# Patient Record
Sex: Female | Born: 1995 | Race: Black or African American | Hispanic: No | Marital: Single | Smoking: Never smoker
Health system: Southern US, Community
[De-identification: ages and names within clinical notes are randomized; demographics above are authoritative.]

---

## 2016-02-14 ENCOUNTER — Encounter (HOSPITAL_COMMUNITY): Payer: Self-pay | Admitting: Family Medicine

## 2016-02-14 ENCOUNTER — Ambulatory Visit (HOSPITAL_COMMUNITY)
Admission: EM | Admit: 2016-02-14 | Discharge: 2016-02-14 | Disposition: A | Discharge status: Home / Self Care | Payer: Medicaid Other | Attending: Internal Medicine | Admitting: Internal Medicine

## 2016-02-14 DIAGNOSIS — A499 Bacterial infection, unspecified: Secondary | ICD-10-CM

## 2016-02-14 DIAGNOSIS — N760 Acute vaginitis: Secondary | ICD-10-CM | POA: Diagnosis not present

## 2016-02-14 DIAGNOSIS — B9689 Other specified bacterial agents as the cause of diseases classified elsewhere: Secondary | ICD-10-CM

## 2016-02-14 MED ORDER — METRONIDAZOLE 500 MG PO TABS
500.0000 mg | ORAL_TABLET | Freq: Two times a day (BID) | ORAL | 2 refills | Status: DC
Start: 2016-02-14 — End: 2016-09-21

## 2016-02-14 NOTE — ED Triage Notes (Signed)
Pt here for vaginal discharge. sts creamy and white. sts hx of chronic BV.

## 2016-02-14 NOTE — Discharge Instructions (Signed)
Continue probiotics

## 2016-02-15 NOTE — ED Provider Notes (Signed)
CSN: [id number]     Arrival date & time 02/14/16  1750 History   First MD Initiated Contact with Patient 02/14/16 1806     Chief Complaint  Patient presents with  . Vaginal Discharge   (Consider location/radiation/quality/duration/timing/severity/associated sxs/prior Treatment) HPI 20 Y/O FEMALE STATES SHE IS HAVING A FLARE OF BV. NOTED ODOR AND DISCHARGE 2 DAYS AGO. NOT SEXUALLY ACTIVE. NO PAIN, NO OTHER COMPLAINTS.  History reviewed. No pertinent past medical history. History reviewed. No pertinent surgical history. History reviewed. No pertinent family history. Social History  Substance Use Topics  . Smoking status: Never Smoker  . Smokeless tobacco: Never Used  . Alcohol use Not on file   OB History    No data available     Review of Systems  Denies: HEADACHE, NAUSEA, ABDOMINAL PAIN, CHEST PAIN, CONGESTION, DYSURIA, SHORTNESS OF BREATH  Allergies  Review of patient's allergies indicates no known allergies.  Home Medications   Prior to Admission medications   Medication Sig Start Date End Date Taking? Authorizing Provider  metroNIDAZOLE (FLAGYL) 500 MG tablet Take 1 tablet (500 mg total) by mouth 2 (two) times daily. 02/14/16   Tharon AquasFrank C Agustina Witzke, PA   Meds Ordered and Administered this Visit  Medications - No data to display  BP 133/65   Pulse 70   Temp 98.5 F (36.9 C)   Resp 18   LMP 12/13/2015   SpO2 100%  No data found.   Physical Exam NURSES NOTES AND VITAL SIGNS REVIEWED. CONSTITUTIONAL: Well developed, well nourished, no acute distress HEENT: normocephalic, atraumatic EYES: Conjunctiva normal NECK:normal ROM, supple, no adenopathy PULMONARY:No respiratory distress, normal effort ABDOMINAL: Soft, ND, NT BS+, No CVAT MUSCULOSKELETAL: Normal ROM of all extremities,  SKIN: warm and dry without rash PSYCHIATRIC: Mood and affect, behavior are normal  Urgent Care Course   Clinical Course    Procedures (including critical care time)  Labs Review Labs  Reviewed - No data to display  Imaging Review No results found.   Visual Acuity Review  Right Eye Distance:   Left Eye Distance:   Bilateral Distance:    Right Eye Near:   Left Eye Near:    Bilateral Near:       PT STATES SHE HAS CHRONIC BV, AND HAS A FLARE AT THIS TIME, SHE KINDLY DECLINES PELVIC. NOT SEXUALLY ACTIVE, STATES GEL IS TOO MESSY, WOULD PREFER PILLS.  ALSO HAS USED PROBIOTICS AND BORIC ACID, WHICH SHE STATES HELPS A GREAT DEAL.   MDM   1. BV (bacterial vaginosis)     Patient is reassured that there are no issues that require transfer to higher level of care at this time or additional tests. Patient is advised to continue home symptomatic treatment. Patient is advised that if there are new or worsening symptoms to attend the emergency department, contact primary care provider, or return to UC. Instructions of care provided discharged home in stable condition.    THIS NOTE WAS GENERATED USING A VOICE RECOGNITION SOFTWARE PROGRAM. ALL REASONABLE EFFORTS  WERE MADE TO PROOFREAD THIS DOCUMENT FOR ACCURACY.  I have verbally reviewed the discharge instructions with the patient. A printed AVS was given to the patient.  All questions were answered prior to discharge.      Tharon AquasFrank C Fredderick Swanger, PA 02/15/16 1002

## 2016-05-01 ENCOUNTER — Emergency Department (HOSPITAL_COMMUNITY)
Admission: EM | Admit: 2016-05-01 | Discharge: 2016-05-01 | Disposition: A | Discharge status: Home / Self Care | Payer: Medicaid Other | Attending: Emergency Medicine | Admitting: Emergency Medicine

## 2016-05-01 ENCOUNTER — Encounter (HOSPITAL_COMMUNITY): Payer: Self-pay

## 2016-05-01 DIAGNOSIS — S3992XA Unspecified injury of lower back, initial encounter: Secondary | ICD-10-CM | POA: Diagnosis present

## 2016-05-01 DIAGNOSIS — S39012A Strain of muscle, fascia and tendon of lower back, initial encounter: Secondary | ICD-10-CM | POA: Insufficient documentation

## 2016-05-01 DIAGNOSIS — Y939 Activity, unspecified: Secondary | ICD-10-CM | POA: Insufficient documentation

## 2016-05-01 DIAGNOSIS — Y92410 Unspecified street and highway as the place of occurrence of the external cause: Secondary | ICD-10-CM | POA: Insufficient documentation

## 2016-05-01 DIAGNOSIS — Y999 Unspecified external cause status: Secondary | ICD-10-CM | POA: Diagnosis not present

## 2016-05-01 MED ORDER — CYCLOBENZAPRINE HCL 10 MG PO TABS: 10.0000 mg | ORAL_TABLET | Freq: Two times a day (BID) | ORAL | 0 refills | Status: DC | PRN
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
## 2016-05-01 NOTE — ED Notes (Signed)
Declined W/C at D/C and was escorted to lobby by RN. 

## 2016-08-08 ENCOUNTER — Emergency Department (HOSPITAL_COMMUNITY)
Admission: EM | Admit: 2016-08-08 | Discharge: 2016-08-08 | Disposition: A | Discharge status: Home / Self Care | Payer: Medicaid Other | Attending: Emergency Medicine | Admitting: Emergency Medicine

## 2016-08-08 ENCOUNTER — Encounter (HOSPITAL_COMMUNITY): Payer: Self-pay | Admitting: Emergency Medicine

## 2016-08-08 DIAGNOSIS — R102 Pelvic and perineal pain: Secondary | ICD-10-CM | POA: Insufficient documentation

## 2016-08-08 DIAGNOSIS — Z79899 Other long term (current) drug therapy: Secondary | ICD-10-CM | POA: Diagnosis not present

## 2016-08-08 DIAGNOSIS — R1030 Lower abdominal pain, unspecified: Secondary | ICD-10-CM | POA: Diagnosis present

## 2016-08-08 DIAGNOSIS — R1032 Left lower quadrant pain: Secondary | ICD-10-CM | POA: Insufficient documentation

## 2016-08-08 LAB — I-STAT BETA HCG BLOOD, ED (MC, WL, AP ONLY): I-stat hCG, quantitative: <5 m[IU]/mL (ref <5)

## 2016-08-08 MED ORDER — IBUPROFEN 800 MG PO TABS: 800.0000 mg | ORAL_TABLET | Freq: Three times a day (TID) | ORAL | 0 refills | Status: DC | PRN

## 2016-08-08 NOTE — Discharge Instructions (Signed)
Follow-up [facility] [place] for follow-up with your OB/GYN doctor if your symptoms do not improve

## 2016-08-08 NOTE — ED Triage Notes (Signed)
Pt c/o abdominal cramping with nausea after having recent sexual intercourse. Pt uses mirana for birth control.
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
## 2016-08-09 LAB — GC/CHLAMYDIA PROBE AMP (~~LOC~~) NOT AT ARMC
CHLAMYDIA, DNA PROBE: NEGATIVE
Neisseria Gonorrhea: NEGATIVE

## 2016-09-21 ENCOUNTER — Ambulatory Visit (INDEPENDENT_AMBULATORY_CARE_PROVIDER_SITE_OTHER): Payer: Medicaid Other | Admitting: Obstetrics

## 2016-09-21 ENCOUNTER — Encounter: Payer: Self-pay | Admitting: *Deleted

## 2016-09-21 ENCOUNTER — Encounter: Payer: Self-pay | Admitting: Obstetrics

## 2016-09-21 VITALS — BP 114/73 | HR 63 | Ht 62.0 in | Wt 147.0 lb

## 2016-09-21 DIAGNOSIS — Z0000 Encounter for general adult medical examination without abnormal findings: Secondary | ICD-10-CM | POA: Diagnosis not present

## 2016-09-21 DIAGNOSIS — Z01419 Encounter for gynecological examination (general) (routine) without abnormal findings: Secondary | ICD-10-CM

## 2016-09-21 DIAGNOSIS — B9689 Other specified bacterial agents as the cause of diseases classified elsewhere: Secondary | ICD-10-CM

## 2016-09-21 DIAGNOSIS — N760 Acute vaginitis: Secondary | ICD-10-CM

## 2016-09-21 MED ORDER — METRONIDAZOLE 500 MG PO TABS: 500.0000 mg | ORAL_TABLET | Freq: Two times a day (BID) | ORAL | 5 refills | Status: DC

## 2016-09-21 NOTE — Progress Notes (Signed)
Pt presents for IUD removal. She c/o recurrent BV since insertion May 2015. Pt unsure which BC she wants. BC option list given today. Pt states it has been long time since last annual. Pt declined STD blood work today.

## 2016-09-21 NOTE — Progress Notes (Signed)
Subjective:        Andrea Carpenter is a 21 y.o. female here for a routine exam.  Current complaints: Chronic BV.  She feels that this is caused by IUD.    Personal health questionnaire:  Is patient Ashkenazi Jewish, have a family history of breast and/or ovarian cancer: no Is there a family history of uterine cancer diagnosed at age < 8, gastrointestinal cancer, urinary tract cancer, family member who is a Personnel officer syndrome-associated carrier: no Is the patient overweight and hypertensive, family history of diabetes, personal history of gestational diabetes, preeclampsia or PCOS: no Is patient over 62, have PCOS,  family history of premature CHD under age 42, diabetes, smoke, have hypertension or peripheral artery disease:  no At any time, has a partner hit, kicked or otherwise hurt or frightened you?: no Over the past 2 weeks, have you felt down, depressed or hopeless?: no Over the past 2 weeks, have you felt little interest or pleasure in doing things?:no   Gynecologic History Patient's last menstrual period was 09/15/2016. Contraception: IUD Last Pap: n/a. Results were: n/a Last mammogram: n/a. Results were: n/a  Obstetric History OB History  Gravida Para Term Preterm AB Living  1 0 0 0 0 1  SAB TAB Ectopic Multiple Live Births  0 0 0 0 0    # Outcome Date GA Lbr Len/2nd Weight Sex Delivery Anes PTL Lv  1 Gravida 10/15/13    F Vag-Spont EPI        History reviewed. No pertinent past medical history.  History reviewed. No pertinent surgical history.   Current Outpatient Prescriptions:  .  levonorgestrel (MIRENA) 20 MCG/24HR IUD, 1 each by Intrauterine route once., Disp: , Rfl:  .  cyclobenzaprine (FLEXERIL) 10 MG tablet, Take 1 tablet (10 mg total) by mouth 2 (two) times daily as needed for muscle spasms. (Patient not taking: Reported on 08/08/2016), Disp: 20 tablet, Rfl: 0 .  ibuprofen (ADVIL,MOTRIN) 800 MG tablet, Take 1 tablet (800 mg total) by mouth every 8 (eight)  hours as needed for moderate pain., Disp: 15 tablet, Rfl: 0 .  metroNIDAZOLE (FLAGYL) 500 MG tablet, Take 1 tablet (500 mg total) by mouth 2 (two) times daily., Disp: 14 tablet, Rfl: 5 Allergies  Allergen Reactions  . Other Hives    All Pets   . Shrimp [Shellfish Allergy] Hives    Social History  Substance Use Topics  . Smoking status: Never Smoker  . Smokeless tobacco: Never Used  . Alcohol use Yes    Family History  Problem Relation Age of Onset  . Fibroids Mother   . Hypertension Mother   . Hypertension Father       Review of Systems  Constitutional: negative for fatigue and weight loss Respiratory: negative for cough and wheezing Cardiovascular: negative for chest pain, fatigue and palpitations Gastrointestinal: negative for abdominal pain and change in bowel habits Musculoskeletal:negative for myalgias Neurological: negative for gait problems and tremors Behavioral/Psych: negative for abusive relationship, depression Endocrine: negative for temperature intolerance    Genitourinary:negative for abnormal menstrual periods, genital lesions, hot flashes, sexual problems and vaginal discharge Integument/breast: negative for breast lump, breast tenderness, nipple discharge and skin lesion(s)    Objective:       BP 114/73   Pulse 63   Ht 5\' 2"  (1.575 m)   Wt 147 lb (66.7 kg)   LMP 09/15/2016   BMI 26.89 kg/m  General:   alert  Skin:   no rash or abnormalities  Lungs:  clear to auscultation bilaterally  Heart:   regular rate and rhythm, S1, S2 normal, no murmur, click, rub or gallop  Breasts:   normal without suspicious masses, skin or nipple changes or axillary nodes  Abdomen:  normal findings: no organomegaly, soft, non-tender and no hernia  Pelvis:  External genitalia: normal general appearance Urinary system: urethral meatus normal and bladder without fullness, nontender Vaginal: normal without tenderness, induration or masses Cervix: normal  appearance Adnexa: normal bimanual exam Uterus: anteverted and non-tender, normal size   Lab Review Urine pregnancy test Labs reviewed yes Radiologic studies reviewed no  50% of 20 min visit spent on counseling and coordination of care.    Assessment:    Healthy female exam.    Contraceptive Surveillance.  Pleased with IUD.    Chronic BV.    Plan:   Continue Mirena IUD.  Reassured patient that IUD not causing chronic BV Flagyl Rx  - chronic regimen after the period for 6 months   Education reviewed: low fat, low cholesterol diet, safe sex/STD prevention, self breast exams and weight bearing exercise. Contraception: IUD. Follow up in: 6 months.  Repeat wet prep for BV follow up  Meds ordered this encounter  Medications  . metroNIDAZOLE (FLAGYL) 500 MG tablet    Sig: Take 1 tablet (500 mg total) by mouth 2 (two) times daily.    Dispense:  14 tablet    Refill:  5   No orders of the defined types were placed in this encounter.    Patient ID: Andrea Carpenter, female   DOB: 09-15-1995, 21 y.o.   MRN: [id number]

## 2016-09-23 ENCOUNTER — Other Ambulatory Visit: Payer: Self-pay | Admitting: Obstetrics

## 2016-09-23 DIAGNOSIS — B373 Candidiasis of vulva and vagina: Secondary | ICD-10-CM

## 2016-09-23 DIAGNOSIS — B3731 Acute candidiasis of vulva and vagina: Secondary | ICD-10-CM

## 2016-09-23 LAB — CERVICOVAGINAL ANCILLARY ONLY
BACTERIAL VAGINITIS: POSITIVE — AB
CANDIDA VAGINITIS: POSITIVE — AB
CHLAMYDIA, DNA PROBE: NEGATIVE
Neisseria Gonorrhea: NEGATIVE
Trichomonas: NEGATIVE

## 2016-09-23 MED ORDER — FLUCONAZOLE 150 MG PO TABS: 150.0000 mg | ORAL_TABLET | Freq: Once | ORAL | 0 refills | Status: AC

## 2016-11-09 ENCOUNTER — Encounter (HOSPITAL_COMMUNITY): Payer: Self-pay | Admitting: Emergency Medicine

## 2016-11-09 ENCOUNTER — Ambulatory Visit (HOSPITAL_COMMUNITY)
Admission: EM | Admit: 2016-11-09 | Discharge: 2016-11-09 | Disposition: A | Discharge status: Home / Self Care | Payer: Medicaid Other | Attending: Internal Medicine | Admitting: Internal Medicine

## 2016-11-09 DIAGNOSIS — L0291 Cutaneous abscess, unspecified: Secondary | ICD-10-CM | POA: Diagnosis not present

## 2016-11-09 MED ORDER — LIDOCAINE HCL (PF) 1 % IJ SOLN
INTRAMUSCULAR | Status: AC
  Filled 2016-11-09: qty 2

## 2016-11-09 MED ORDER — HYDROCODONE-ACETAMINOPHEN 5-325 MG PO TABS: 1.0000 | ORAL_TABLET | Freq: Four times a day (QID) | ORAL | 0 refills | Status: DC | PRN

## 2016-11-09 MED ORDER — SULFAMETHOXAZOLE-TRIMETHOPRIM 800-160 MG PO TABS: 1.0000 | ORAL_TABLET | Freq: Two times a day (BID) | ORAL | 0 refills | Status: AC

## 2016-11-09 MED ORDER — CEFTRIAXONE SODIUM 1 G IJ SOLR
INTRAMUSCULAR | Status: AC
  Filled 2016-11-09: qty 10

## 2016-11-09 MED ORDER — CEFTRIAXONE SODIUM 1 G IJ SOLR
1.0000 g | Freq: Once | INTRAMUSCULAR | Status: AC
  Administered 2016-11-09: 1 g via INTRAMUSCULAR

## 2016-11-09 NOTE — ED Triage Notes (Signed)
[facility] with a complaint of an abscess on the left side of her vaginal area that has been there for 3 days.

## 2016-11-09 NOTE — Discharge Instructions (Signed)
Given the location of your abscess, I recommend against draining it here in the urgent care. We have given an injection of antibiotic, and I have started you on an antibiotic to take at home called Bactrim. Recommend sitting in a warm tub at least twice a day, or using warm compresses 15 minutes at a time for more times a day in the area. I recommend scheduling appointment with her gynecologist, for evaluation, and drainage of this abscess. I have also prescribed a medicine for pain called hydrocodone, this medicine is a narcotic, it will cause drowsiness, and it is addictive. Do not take more than what is necessary, do not drink alcohol while taking, and do not operate any heavy machinery while taking this medicine.

## 2016-11-09 NOTE — ED Provider Notes (Signed)
CSN: [id number]     Arrival date & time 11/09/16  1906 History   First MD Initiated Contact with Patient 11/09/16 1946     Chief Complaint  Patient presents with  . Abscess   (Consider location/radiation/quality/duration/timing/severity/associated sxs/prior Treatment) The history is provided by the patient.  Abscess  Location:  Pelvis Pelvic abscess location:  Groin Size:  4 cm Abscess quality: fluctuance, induration, painful, redness and warmth   Red streaking: no   Duration:  5 days Progression:  Worsening Pain details:    Quality:  Pressure and sharp   Severity:  Severe   Duration:  4 days   Timing:  Constant   Progression:  Worsening Chronicity:  Recurrent Context: not diabetes, not immunosuppression and not skin injury   Relieved by:  Warm water soaks and topical antibiotics Worsened by:  Draining/squeezing Associated symptoms: no anorexia, no fatigue, no fever, no headaches, no nausea and no vomiting     History reviewed. No pertinent past medical history. History reviewed. No pertinent surgical history. Family History  Problem Relation Age of Onset  . Fibroids Mother   . Hypertension Mother   . Hypertension Father    Social History  Substance Use Topics  . Smoking status: Never Smoker  . Smokeless tobacco: Never Used  . Alcohol use Yes   OB History    Gravida Para Term Preterm AB Living   1 0 0 0 0 1   SAB TAB Ectopic Multiple Live Births   0 0 0 0 0     Review of Systems  Constitutional: Negative for fatigue and fever.  HENT: Negative.   Respiratory: Negative.   Cardiovascular: Negative.   Gastrointestinal: Negative for anorexia, nausea and vomiting.  Genitourinary: Negative.   Skin:       abscess  Neurological: Negative for light-headedness and headaches.    Allergies  Other and Shrimp [shellfish allergy]  Home Medications   Prior to Admission medications   Medication Sig Start Date End Date Taking? Authorizing Provider  levonorgestrel  (MIRENA) 20 MCG/24HR IUD 1 each by Intrauterine route once.   Yes [provider]  HYDROcodone-acetaminophen (NORCO/VICODIN) 5-325 MG tablet Take 1 tablet by mouth every 6 (six) hours as needed. 11/09/16   Dorena Bodo, NP  sulfamethoxazole-trimethoprim (BACTRIM DS,SEPTRA DS) 800-160 MG tablet Take 1 tablet by mouth 2 (two) times daily. 11/09/16 11/16/16  Dorena Bodo, NP   Meds Ordered and Administered this Visit   Medications  cefTRIAXone (ROCEPHIN) injection 1 g (1 g Intramuscular Given 11/09/16 2008)    BP 114/68 (BP Location: Right Arm)   Pulse 93   Temp 98.7 F (37.1 C) (Oral)   Resp 16   SpO2 100%  No data found.   Physical Exam  Constitutional: She is oriented to person, place, and time. She appears well-developed and well-nourished. No distress.  HENT:  Head: Normocephalic and atraumatic.  Right Ear: External ear normal.  Left Ear: External ear normal.  Eyes: Conjunctivae are normal.  Neck: Normal range of motion.  Genitourinary:    Pelvic exam was performed with patient supine.  Neurological: She is alert and oriented to person, place, and time.  Skin: Skin is warm and dry. Capillary refill takes less than 2 seconds. No rash noted. She is not diaphoretic. No erythema.  Psychiatric: She has a normal mood and affect. Her behavior is normal.  Nursing note and vitals reviewed.   Urgent Care Course     Procedures (including critical care time)  Labs Review  Labs Reviewed - No data to display  Imaging Review No results found.   MDM   1. Abscess    Given the location of the abscess, strongly recommend against draining here in clinic. Given ceftriaxone, Bactrim, prescription for hydrocodone for pain, advised warm compresses, and warm soaks, and recommend following up with her gynecologist for drainage.  [place] substances [place] consulted prior to writing prescription. No prescriptions for controlled substances were  written in the last 6 months.     Dorena BodoKennard, Jashawn Floyd, NP 11/09/16 2011

## 2016-11-12 ENCOUNTER — Encounter (HOSPITAL_COMMUNITY): Payer: Self-pay

## 2016-11-12 ENCOUNTER — Emergency Department (HOSPITAL_COMMUNITY)
Admission: EM | Admit: 2016-11-12 | Discharge: 2016-11-12 | Disposition: A | Discharge status: Home / Self Care | Payer: Medicaid Other | Attending: Emergency Medicine | Admitting: Emergency Medicine

## 2016-11-12 DIAGNOSIS — Z79899 Other long term (current) drug therapy: Secondary | ICD-10-CM | POA: Diagnosis not present

## 2016-11-12 DIAGNOSIS — N764 Abscess of vulva: Secondary | ICD-10-CM | POA: Insufficient documentation

## 2016-11-12 MED ORDER — LIDOCAINE-EPINEPHRINE (PF) 2 %-1:200000 IJ SOLN
20.0000 mL | Freq: Once | INTRAMUSCULAR | Status: DC
  Filled 2016-11-12: qty 20

## 2016-11-12 MED ORDER — CLINDAMYCIN HCL 300 MG PO CAPS: 300.0000 mg | ORAL_CAPSULE | Freq: Four times a day (QID) | ORAL | 0 refills | Status: AC

## 2016-11-12 NOTE — ED Triage Notes (Signed)
Pt endorses labial abscess x 1 week. [facility] were afraid to I&D, and was sent to gynecologist today where they poked a hole in it but it's not draining. Oral temp 99.5 in triage. VSS.

## 2016-11-12 NOTE — Discharge Instructions (Signed)
Continue your antibiotics and warm soaks. You declined further drainage today. Follow up with her gynecologist to recheck this next week. Return to the ED if pain worsens, he developed a fever, vomiting or any other concerns.

## 2016-11-12 NOTE — ED Provider Notes (Signed)
[facility] Provider Note   CSN: [id number] Arrival date & time: 11/12/16  0159  By signing my name below, I, Karren Cobble, attest that this documentation has been prepared under the direction and in the presence of Nakeyia Menden, Jeannett Senior, MD. Electronically Signed: Karren Cobble, ED Scribe. 11/12/16. 4:11 AM.  History   Chief Complaint Chief Complaint  Patient presents with  . Abscess   HPI  HPI Comments: Andrea Carpenter is a 21 y.o. female who presents to the Emergency Department complaining of persistent, gradually worsening, swelling and pain to the left groin area that started one week ago. Pt was seen at urgent care twp days ago and was told they where unable to open the area and she was prescribed bactrim. Pt had no relief and went to [facility] today where they poked a single hole in the area with a needle and she notes adequate draining. She was given a 10 day supply of Clindamycin. She has had relief with warm soaks and palm presses. Denies fever, nausea, vomiting, or abdominal pain. She returns tonight because she is worried that the area is no longer draining.  History reviewed. No pertinent past medical history.  There are no active problems to display for this patient.   History reviewed. No pertinent surgical history.  OB History    Gravida Para Term Preterm AB Living   1 0 0 0 0 1   SAB TAB Ectopic Multiple Live Births   0 0 0 0 0       Home Medications    Prior to Admission medications   Medication Sig Start Date End Date Taking? Authorizing Provider  HYDROcodone-acetaminophen (NORCO/VICODIN) 5-325 MG tablet Take 1 tablet by mouth every 6 (six) hours as needed. 11/09/16   Dorena Bodo, NP  levonorgestrel (MIRENA) 20 MCG/24HR IUD 1 each by Intrauterine route once.    [provider]  sulfamethoxazole-trimethoprim (BACTRIM DS,SEPTRA DS) 800-160 MG tablet Take 1 tablet by mouth 2 (two) times daily. 11/09/16 11/16/16  Dorena Bodo, NP     Family History Family History  Problem Relation Age of Onset  . Fibroids Mother   . Hypertension Mother   . Hypertension Father     Social History Social History  Substance Use Topics  . Smoking status: Never Smoker  . Smokeless tobacco: Never Used  . Alcohol use Yes     Comment: occ     Allergies   Other and Shrimp [shellfish allergy]   Review of Systems Review of Systems  Constitutional: Negative for fever.  Gastrointestinal: Negative for abdominal pain, nausea and vomiting.  Genitourinary: Positive for vaginal pain.     Physical Exam Updated Vital Signs BP 112/77 (BP Location: Right Arm)   Pulse 99   Temp 99.5 F (37.5 C) (Oral)   Ht 5\' 2"  (1.575 m)   Wt 148 lb (67.1 kg)   LMP 10/21/2016 (Exact Date)   SpO2 99%   BMI 27.07 kg/m   Physical Exam  Constitutional: She is oriented to person, place, and time. She appears well-developed and well-nourished. No distress.  HENT:  Head: Normocephalic and atraumatic.  Mouth/Throat: Oropharynx is clear and moist. No oropharyngeal exudate.  Eyes: Conjunctivae and EOM are normal. Pupils are equal, round, and reactive to light.  Neck: Normal range of motion. Neck supple.  No meningismus.  Cardiovascular: Normal rate, regular rhythm, normal heart sounds and intact distal pulses.   No murmur heard. Pulmonary/Chest: Effort normal and breath sounds normal. No respiratory distress.  Abdominal: Soft.  There is no tenderness. There is no rebound and no guarding.  Genitourinary:  Genitourinary Comments: Chaperone present for exam.  Draining abscess to left labial majora surrounding induration without fluctuance. Induration extends to left medial thigh without fluctuance.    Musculoskeletal: Normal range of motion. She exhibits no edema or tenderness.  Neurological: She is alert and oriented to person, place, and time. No cranial nerve deficit. She exhibits normal muscle tone. Coordination normal.   5/5 strength throughout.  CN 2-12 intact.Equal grip strength.   Skin: Skin is warm.  Psychiatric: She has a normal mood and affect. Her behavior is normal.  Nursing note and vitals reviewed.   ED Treatments / Results  DIAGNOSTIC STUDIES: Oxygen Saturation is 99% on RA, normal by my interpretation.   COORDINATION OF CARE: 3:09 AM-Discussed next steps with pt. Pt verbalized understanding and is agreeable with the plan.    Labs (all labs ordered are listed, but only abnormal results are displayed) Labs Reviewed - No data to display  EKG  EKG Interpretation None       Radiology No results found.  Procedures Procedures (including critical care time)  Medications Ordered in ED Medications  lidocaine-EPINEPHrine (XYLOCAINE W/EPI) 2 %-1:200000 (PF) injection 20 mL (20 mLs Infiltration Given 11/12/16 0301)     Initial Impression / Assessment and Plan / ED Course  I have reviewed the triage vital signs and the nursing notes.  Pertinent labs & imaging results that were available during my care of the patient were reviewed by me and considered in my medical decision making (see chart for details).     Patient with draining abscess to left labia majora. There is surrounding induration but no fluctuance. Minimal surrounding erythema.  Purulence expressed with palpation of area.  Discussed with patient that abscess appears to be draining. Offered further anesthesia and further attempted drainage which patient declines.  Continue antibiotics, warm soaks, follow-up with her gynecologist. Patient states she thinks this is fine now that she is sure the area is draining. Return precautions discussed.  Final Clinical Impressions(s) / ED Diagnoses   Final diagnoses:  Abscess of labia majora    New Prescriptions New Prescriptions   No medications on file  I personally performed the services described in this documentation, which was scribed in my presence. The recorded information has been reviewed and  is accurate.     Glynn Octaveancour, Earon Rivest, MD 11/12/16 [phone]

## 2016-11-12 NOTE — ED Notes (Signed)
Pt stated she could not wait for discharge paperwork.  She reported that she fully understood the instructions from the provider.

## 2017-01-19 ENCOUNTER — Encounter (HOSPITAL_COMMUNITY): Payer: Self-pay

## 2017-01-19 ENCOUNTER — Ambulatory Visit (HOSPITAL_COMMUNITY)
Admission: EM | Admit: 2017-01-19 | Discharge: 2017-01-19 | Disposition: A | Discharge status: Home / Self Care | Payer: Medicaid Other | Attending: Family Medicine | Admitting: Family Medicine

## 2017-01-19 DIAGNOSIS — N898 Other specified noninflammatory disorders of vagina: Secondary | ICD-10-CM | POA: Diagnosis not present

## 2017-01-19 MED ORDER — CEFTRIAXONE SODIUM 250 MG IJ SOLR
250.0000 mg | Freq: Once | INTRAMUSCULAR | Status: AC
  Administered 2017-01-19: 250 mg via INTRAMUSCULAR

## 2017-01-19 MED ORDER — FLUCONAZOLE 150 MG PO TABS: ORAL_TABLET | ORAL | 0 refills | Status: AC

## 2017-01-19 MED ORDER — LIDOCAINE HCL (PF) 1 % IJ SOLN
INTRAMUSCULAR | Status: AC
  Filled 2017-01-19: qty 2

## 2017-01-19 MED ORDER — AZITHROMYCIN 250 MG PO TABS
ORAL_TABLET | ORAL | Status: AC
  Filled 2017-01-19: qty 4

## 2017-01-19 MED ORDER — CEFTRIAXONE SODIUM 250 MG IJ SOLR
INTRAMUSCULAR | Status: AC
  Filled 2017-01-19: qty 250

## 2017-01-19 MED ORDER — AZITHROMYCIN 250 MG PO TABS
1000.0000 mg | ORAL_TABLET | Freq: Once | ORAL | Status: AC
  Administered 2017-01-19: 1000 mg via ORAL

## 2017-01-19 NOTE — ED Notes (Signed)
Patient discharged by provider.

## 2017-01-19 NOTE — Discharge Instructions (Signed)
Today you were diagnosed with the following: 1. Vaginal discharge     You have been prescribed prescription medications this visit:  Meds ordered this encounter  Medications   fluconazole (DIFLUCAN) 150 MG tablet    Sig: Take one tablet by mouth as a single dose.    Dispense:  1 tablet    Refill:  0   Please take and complete all medications as prescribed.  If you are not improving over the next few days or feel you are worsening please follow up here or the Emergency Department if you are unable to see your regular doctor.  You have been given the following medications today for treatment of suspected gonorrhea and/or chlamydia:  cefTRIAXone (ROCEPHIN) injection 250 mg azithromycin (ZITHROMAX) tablet 1,000 mg  Even though we have treated you today, we have sent testing for sexually transmitted infections. We will notify you of the results once they are received.

## 2017-01-19 NOTE — ED Triage Notes (Signed)
[facility]. Pt states discharge yellowish-Pearlene Teat with an odor, pt denies any pressure during urination

## 2017-01-19 NOTE — ED Provider Notes (Signed)
  [facility]   [id number] 01/19/17 Arrival Time: 1734  ASSESSMENT & PLAN:  Today you were diagnosed with the following: 1. Vaginal discharge     You have been prescribed prescription medications this visit:  Meds ordered this encounter  Medications  . fluconazole (DIFLUCAN) 150 MG tablet    Sig: Take one tablet by mouth as a single dose.    Dispense:  1 tablet    Refill:  0   Please take and complete all medications as prescribed.  If you are not improving over the next few days or feel you are worsening please follow up here or the Emergency Department if you are unable to see your regular doctor.  You have been given the following medications today for treatment of suspected gonorrhea and/or chlamydia:  cefTRIAXone (ROCEPHIN) injection 250 mg azithromycin (ZITHROMAX) tablet 1,000 mg  Even though we have treated you today, we have sent testing for sexually transmitted infections. We will notify you of the results once they are received.  I did not treat for BV as she just completed a course of Clindamcyin. Reviewed expectations re: course of current medical issues. Questions answered. Outlined signs and symptoms indicating need for more acute intervention. Patient verbalized understanding. After Visit Summary given.   SUBJECTIVE:  Andrea BoomSimone Carpenter is a 21 y.o. female who presents with complaint of vaginal discharge for several days. Self-treated for BV with Clindamycin BID for 7 days. No change in symptoms. Discharge is whitish/yellowish. Afebrile. No pelvic or abdominal pain. Sexually active with two female partners. No condom use. H/O Chlamydia in the past.  ROS: As per HPI.   OBJECTIVE:  Vitals:   01/19/17 1747  BP: 115/68  Pulse: 82  Resp: 16  Temp: 99.5 F (37.5 C)  TempSrc: Oral  SpO2: 100%     General appearance: alert, cooperative, appears stated age and no distress Throat: lips, mucosa, and tongue normal; teeth and gums normal Back: no CVA  tenderness Abdomen: soft, non-tender; bowel sounds normal; no masses or organomegaly; no guarding or rebound tenderness GU: declines Skin: warm and dry Psychological:  Alert and cooperative. Normal mood and affect.   Labs Reviewed  URINE CYTOLOGY ANCILLARY ONLY  Pending.   Allergies  Allergen Reactions  . Other Hives    All Pets   . Shrimp [Shellfish Allergy] Hives    PMHx, SurgHx, SocialHx, Medications, and Allergies were reviewed in the Visit Navigator and updated as appropriate.       Mardella LaymanHagler, Sameen Leas, MD 01/19/17 1800

## 2017-01-20 ENCOUNTER — Encounter (HOSPITAL_COMMUNITY): Payer: Self-pay

## 2017-01-20 ENCOUNTER — Ambulatory Visit (HOSPITAL_COMMUNITY)
Admission: EM | Admit: 2017-01-20 | Discharge: 2017-01-20 | Disposition: A | Discharge status: Home / Self Care | Payer: Medicaid Other | Attending: Family Medicine | Admitting: Family Medicine

## 2017-01-20 DIAGNOSIS — N765 Ulceration of vagina: Secondary | ICD-10-CM

## 2017-01-20 DIAGNOSIS — N898 Other specified noninflammatory disorders of vagina: Secondary | ICD-10-CM | POA: Diagnosis present

## 2017-01-20 MED ORDER — ACYCLOVIR 800 MG PO TABS: 800.0000 mg | ORAL_TABLET | Freq: Every day | ORAL | 0 refills | Status: AC

## 2017-01-20 NOTE — ED Provider Notes (Signed)
CSN: [id number]     Arrival date & time 01/20/17  1416 History   None    Chief Complaint  Patient presents with  . Rash   (Consider location/radiation/quality/duration/timing/severity/associated sxs/prior Treatment) Patient c/o vaginal lesion and it hurts and she states it hurts when she urinates and the urine touches the sore.   The history is provided by the patient.  Vaginal Itching  This is a new problem. The problem occurs constantly. Nothing aggravates the symptoms. Nothing relieves the symptoms.    History reviewed. No pertinent past medical history. History reviewed. No pertinent surgical history. Family History  Problem Relation Age of Onset  . Fibroids Mother   . Hypertension Mother   . Hypertension Father    Social History  Substance Use Topics  . Smoking status: Never Smoker  . Smokeless tobacco: Never Used  . Alcohol use Yes     Comment: occ   OB History    Gravida Para Term Preterm AB Living   1 0 0 0 0 1   SAB TAB Ectopic Multiple Live Births   0 0 0 0 0     Review of Systems  Constitutional: Negative.   HENT: Negative.   Eyes: Negative.   Respiratory: Negative.   Cardiovascular: Negative.   Gastrointestinal: Negative.   Endocrine: Negative.   Genitourinary: Positive for genital sores.  Musculoskeletal: Negative.   Allergic/Immunologic: Negative.   Neurological: Negative.   Hematological: Negative.   Psychiatric/Behavioral: Negative.     Allergies  Other and Shrimp [shellfish allergy]  Home Medications   Prior to Admission medications   Medication Sig Start Date End Date Taking? Authorizing Provider  fluconazole (DIFLUCAN) 150 MG tablet Take one tablet by mouth as a single dose. 01/19/17  Yes Mardella LaymanHagler, Brian, MD  acyclovir (ZOVIRAX) 800 MG tablet Take 1 tablet (800 mg total) by mouth 5 (five) times daily. 01/20/17   Deatra Canterxford, Tiler Brandis J, FNP  clindamycin (CLEOCIN) 300 MG capsule Take 1 capsule (300 mg total) by mouth every 6 (six) hours. 11/12/16    Rancour, Jeannett SeniorStephen, MD  HYDROcodone-acetaminophen (NORCO/VICODIN) 5-325 MG tablet Take 1 tablet by mouth every 6 (six) hours as needed. 11/09/16   Dorena BodoKennard, Lawrence, NP  levonorgestrel (MIRENA) 20 MCG/24HR IUD 1 each by Intrauterine route once.    [provider]   Meds Ordered and Administered this Visit  Medications - No data to display  BP 95/60 (BP Location: Left Arm)   Pulse 72   Temp 98.9 F (37.2 C) (Oral)   Resp 15   Ht 5\' 2"  (1.575 m)   Wt 148 lb (67.1 kg)   LMP 12/28/2016 (Exact Date)   SpO2 99%   BMI 27.07 kg/m  No data found.   Physical Exam  Constitutional: She appears well-developed and well-nourished.  HENT:  Head: Normocephalic and atraumatic.  Eyes: Pupils are equal, round, and reactive to light. Conjunctivae and EOM are normal.  Neck: Normal range of motion. Neck supple.  Cardiovascular: Normal rate, regular rhythm and normal heart sounds.   Pulmonary/Chest: Effort normal and breath sounds normal.  Genitourinary:  Genitourinary Comments: External genitalia with sores and inner left labia/ vagina with ulcer that is tender.  Nursing note and vitals reviewed.   Urgent Care Course     Procedures (including critical care time)  Labs Review Labs Reviewed  HSV CULTURE AND TYPING  RPR  HIV ANTIBODY (ROUTINE TESTING)    Imaging Review No results found.   Visual Acuity Review  Right Eye Distance:  Left Eye Distance:   Bilateral Distance:    Right Eye Near:   Left Eye Near:    Bilateral Near:         MDM   1. Vaginal ulcer    Acyclovir 800mg  one po 5x day for 7 days #35  Herpes culture HIV RPR     Deatra CanterOxford, Haidee Stogsdill J, FNP 01/20/17 1506

## 2017-01-20 NOTE — ED Triage Notes (Signed)
Pt. Here for sore noted on left labia today. Pt. Here yesterday for abnormal d/c and given medications to tx vaginal infections. Pt. Thought it was an ingrown hair, but states it is different. Pt. States it hurts when she urinates.

## 2017-01-21 LAB — URINE CYTOLOGY ANCILLARY ONLY
BACTERIAL VAGINITIS: POSITIVE — AB
CANDIDA VAGINITIS: NEGATIVE
Chlamydia: NEGATIVE
Neisseria Gonorrhea: NEGATIVE
TRICH (WINDOWPATH): NEGATIVE

## 2017-01-21 LAB — RPR: RPR Ser Ql: NONREACTIVE

## 2017-01-21 LAB — HIV ANTIBODY (ROUTINE TESTING W REFLEX): HIV Screen 4th Generation wRfx: NONREACTIVE

## 2017-01-22 LAB — HSV CULTURE AND TYPING

## 2017-01-24 ENCOUNTER — Telehealth (HOSPITAL_COMMUNITY): Payer: Self-pay | Admitting: *Deleted

## 2017-01-24 MED ORDER — METRONIDAZOLE 500 MG PO TABS: 500.0000 mg | ORAL_TABLET | Freq: Two times a day (BID) | ORAL | 0 refills | Status: AC

## 2017-01-24 NOTE — Telephone Encounter (Signed)
Results reviewed with patient. Education provided. Patient still having vaginal irritation and discharge, will send prescription to pharmacy of choice.

## 2017-01-24 NOTE — Telephone Encounter (Signed)
-----   Message from Eustace MooreLaura W Murray, MD sent at 01/22/2017 11:20 AM EDT ----- Please let patient know that test for gardnerella (bacterial vaginosis) was positive. This only needs to be treated if there are persistent symptoms, such as vaginal irritation/discharge.  If these symptoms are present, ok to send rx for metronidazole 500mg  bid x 7d #14 no refills or metronidazole vaginal gel 0.75% 1 applicatorful bid x 7d #14 no refills.  If no vaginal irritation/discharge, no treatment needed.  Result note copied to patient's MyChart.  LM

## 2017-10-17 ENCOUNTER — Other Ambulatory Visit: Payer: Self-pay | Admitting: Obstetrics

## 2017-10-17 DIAGNOSIS — N760 Acute vaginitis: Principal | ICD-10-CM

## 2017-10-17 DIAGNOSIS — B9689 Other specified bacterial agents as the cause of diseases classified elsewhere: Secondary | ICD-10-CM

## 2017-10-17 NOTE — Telephone Encounter (Signed)
Refill request, please send if approved.   Name from pharmacy: METRONIDAZOLE 500MG  TAB        Will file in chart as: metroNIDAZOLE (FLAGYL) 500 MG tablet   The source prescription was discontinued on 11/09/2016 by Fuller PlanWooters, Brent D, CMA for the following reason: Error.   Sig: TAKE 1 TABLET BY MOUTH TWICE DAILY   Disp:  14 tablet  Refills:  5   Start: 10/17/2017   Class: Normal   For: BV (bacterial vaginosis)   To pharmacy: Please consider 90 day supplies to promote better adherence   Requested on: 09/21/2016   Last ordered: 1 year ago by Brock Badharles A Harper, MD Last refill: 06/29/2017   Rx #: [id number]

## 2018-02-26 ENCOUNTER — Encounter (HOSPITAL_COMMUNITY): Payer: Self-pay | Admitting: *Deleted

## 2018-02-26 ENCOUNTER — Emergency Department (HOSPITAL_COMMUNITY)
Admission: EM | Admit: 2018-02-26 | Discharge: 2018-02-26 | Disposition: A | Discharge status: Home / Self Care | Payer: BLUE CROSS/BLUE SHIELD | Attending: Emergency Medicine | Admitting: Emergency Medicine

## 2018-02-26 ENCOUNTER — Emergency Department (HOSPITAL_COMMUNITY): Payer: BLUE CROSS/BLUE SHIELD

## 2018-02-26 ENCOUNTER — Other Ambulatory Visit: Payer: Self-pay

## 2018-02-26 DIAGNOSIS — Y929 Unspecified place or not applicable: Secondary | ICD-10-CM | POA: Diagnosis not present

## 2018-02-26 DIAGNOSIS — Y998 Other external cause status: Secondary | ICD-10-CM | POA: Diagnosis not present

## 2018-02-26 DIAGNOSIS — W3400XA Accidental discharge from unspecified firearms or gun, initial encounter: Secondary | ICD-10-CM | POA: Insufficient documentation

## 2018-02-26 DIAGNOSIS — S81802A Unspecified open wound, left lower leg, initial encounter: Secondary | ICD-10-CM | POA: Insufficient documentation

## 2018-02-26 DIAGNOSIS — Y9301 Activity, walking, marching and hiking: Secondary | ICD-10-CM | POA: Diagnosis not present

## 2018-02-26 DIAGNOSIS — S8992XA Unspecified injury of left lower leg, initial encounter: Secondary | ICD-10-CM | POA: Diagnosis present

## 2018-02-26 MED ORDER — FENTANYL CITRATE (PF) 100 MCG/2ML IJ SOLN: 25.0000 ug | Freq: Once | INTRAMUSCULAR | Status: DC

## 2018-02-26 MED ORDER — HYDROCODONE-ACETAMINOPHEN 5-325 MG PO TABS: 1.0000 | ORAL_TABLET | Freq: Four times a day (QID) | ORAL | 0 refills | Status: AC | PRN

## 2018-02-26 MED ORDER — HYDROCODONE-ACETAMINOPHEN 5-325 MG PO TABS
1.0000 | ORAL_TABLET | Freq: Once | ORAL | Status: AC
  Administered 2018-02-26: 1 via ORAL
  Filled 2018-02-26: qty 1

## 2018-02-26 MED ORDER — FENTANYL CITRATE (PF) 100 MCG/2ML IJ SOLN
INTRAMUSCULAR | Status: AC
  Filled 2018-02-26: qty 2

## 2018-02-26 NOTE — ED Notes (Signed)
[employer] CSI at bedside. 
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
## 2018-02-26 NOTE — ED Notes (Signed)
Password of [phone] given to pt to provide to parents

## 2018-05-09 ENCOUNTER — Other Ambulatory Visit: Payer: Self-pay | Admitting: Obstetrics

## 2018-05-09 DIAGNOSIS — B9689 Other specified bacterial agents as the cause of diseases classified elsewhere: Secondary | ICD-10-CM

## 2018-05-09 DIAGNOSIS — N760 Acute vaginitis: Principal | ICD-10-CM
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
